# Patient Record
Sex: Male | Born: 1990 | Race: White | Hispanic: No | Marital: Single | Smoking: Current some day smoker
Health system: Southern US, Community
[De-identification: ages and names within clinical notes are randomized; demographics above are authoritative.]

---
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
## 2011-05-27 ENCOUNTER — Emergency Department (HOSPITAL_COMMUNITY)
Admission: EM | Admit: 2011-05-27 | Discharge: 2011-05-27 | Disposition: A | Discharge status: Home / Self Care | Payer: Medicaid Other | Attending: Emergency Medicine | Admitting: Emergency Medicine

## 2011-05-27 ENCOUNTER — Emergency Department (HOSPITAL_COMMUNITY): Payer: Medicaid Other

## 2011-05-27 DIAGNOSIS — N23 Unspecified renal colic: Secondary | ICD-10-CM | POA: Insufficient documentation

## 2011-05-27 DIAGNOSIS — R109 Unspecified abdominal pain: Secondary | ICD-10-CM | POA: Insufficient documentation

## 2011-05-27 LAB — POCT I-STAT, CHEM 8
BUN: 17 mg/dL (ref 6–23)
Calcium, Ion: 1.18 mmol/L (ref 1.12–1.32)
Creatinine, Ser: 1 mg/dL (ref 0.50–1.35)
Glucose, Bld: 121 mg/dL — ABNORMAL HIGH (ref 70–99)
Hemoglobin: 15.3 g/dL (ref 13.0–17.0)
Potassium: 3.7 mEq/L (ref 3.5–5.1)
TCO2: 24 mmol/L (ref 0–100)

## 2011-05-27 LAB — URINALYSIS, ROUTINE W REFLEX MICROSCOPIC
Glucose, UA: NEGATIVE mg/dL
Ketones, ur: NEGATIVE mg/dL
Nitrite: NEGATIVE
pH: 8 (ref 5.0–8.0)

## 2011-05-29 ENCOUNTER — Emergency Department (HOSPITAL_COMMUNITY)
Admission: EM | Admit: 2011-05-29 | Discharge: 2011-05-29 | Disposition: A | Discharge status: Home / Self Care | Payer: Medicaid Other | Attending: Emergency Medicine | Admitting: Emergency Medicine

## 2011-05-29 DIAGNOSIS — R109 Unspecified abdominal pain: Secondary | ICD-10-CM | POA: Insufficient documentation

## 2011-05-29 DIAGNOSIS — M549 Dorsalgia, unspecified: Secondary | ICD-10-CM | POA: Insufficient documentation

## 2011-05-29 DIAGNOSIS — R112 Nausea with vomiting, unspecified: Secondary | ICD-10-CM | POA: Insufficient documentation

## 2011-05-29 LAB — POCT I-STAT, CHEM 8
Calcium, Ion: 1.19 mmol/L (ref 1.12–1.32)
Glucose, Bld: 95 mg/dL (ref 70–99)
HCT: 46 % (ref 36.0–46.0)
Hemoglobin: 15.6 g/dL — ABNORMAL HIGH (ref 12.0–15.0)

## 2011-05-30 ENCOUNTER — Emergency Department (HOSPITAL_COMMUNITY): Payer: Medicaid Other

## 2011-05-30 ENCOUNTER — Encounter (HOSPITAL_COMMUNITY): Payer: Self-pay | Admitting: Radiology

## 2011-05-30 ENCOUNTER — Emergency Department (HOSPITAL_COMMUNITY)
Admission: EM | Admit: 2011-05-30 | Discharge: 2011-05-31 | Disposition: A | Discharge status: Home / Self Care | Payer: Medicaid Other | Attending: Emergency Medicine | Admitting: Emergency Medicine

## 2011-05-30 DIAGNOSIS — R112 Nausea with vomiting, unspecified: Secondary | ICD-10-CM | POA: Insufficient documentation

## 2011-05-30 DIAGNOSIS — F909 Attention-deficit hyperactivity disorder, unspecified type: Secondary | ICD-10-CM | POA: Insufficient documentation

## 2011-05-30 DIAGNOSIS — R109 Unspecified abdominal pain: Secondary | ICD-10-CM | POA: Insufficient documentation

## 2011-05-30 LAB — CBC
HCT: 40.6 % (ref 39.0–52.0)
MCH: 30.6 pg (ref 26.0–34.0)
MCHC: 36 g/dL (ref 30.0–36.0)
Platelets: 279 10*3/uL (ref 150–400)

## 2011-05-30 LAB — URINALYSIS, ROUTINE W REFLEX MICROSCOPIC
Bilirubin Urine: NEGATIVE
Nitrite: NEGATIVE
Protein, ur: NEGATIVE mg/dL
Specific Gravity, Urine: 1.024 (ref 1.005–1.030)
pH: 8 (ref 5.0–8.0)

## 2011-05-30 LAB — COMPREHENSIVE METABOLIC PANEL
Calcium: 9.8 mg/dL (ref 8.4–10.5)
Glucose, Bld: 91 mg/dL (ref 70–99)
Potassium: 4.1 mEq/L (ref 3.5–5.1)
Sodium: 140 mEq/L (ref 135–145)

## 2011-05-30 LAB — DIFFERENTIAL
Basophils Absolute: 0 10*3/uL (ref 0.0–0.1)
Lymphocytes Relative: 18 % (ref 12–46)
Lymphs Abs: 2.3 10*3/uL (ref 0.7–4.0)
Monocytes Absolute: 0.6 10*3/uL (ref 0.1–1.0)
Neutro Abs: 10 10*3/uL — ABNORMAL HIGH (ref 1.7–7.7)

## 2011-05-30 LAB — ETHANOL: Alcohol, Ethyl (B): <11 mg/dL (ref 0–11)

## 2011-05-30 MED ORDER — IOHEXOL 300 MG/ML  SOLN
100.0000 mL | Freq: Once | INTRAMUSCULAR | Status: AC | PRN
  Administered 2011-05-30: 100 mL via INTRAVENOUS

## 2012-09-27 IMAGING — CT CT ABD-PELV W/O CM
2 of 4 series · 17 of 46 positions shown, 19 images · non-contrast
Comparison: None.

CLINICAL DATA: Bilateral flank pain and nausea.

CT ABDOMEN AND PELVIS WITHOUT CONTRAST
TECHNIQUE: Multidetector CT imaging of the abdomen and pelvis was
performed following the standard protocol without intravenous
contrast.

[Series 2: abd/pel w/o · axial · non-contrast · 0.67mm/px · z∈[+836,+1236]mm · 14 of 88 slices shown, 16 images]
[im 4/88  soft-tissue]
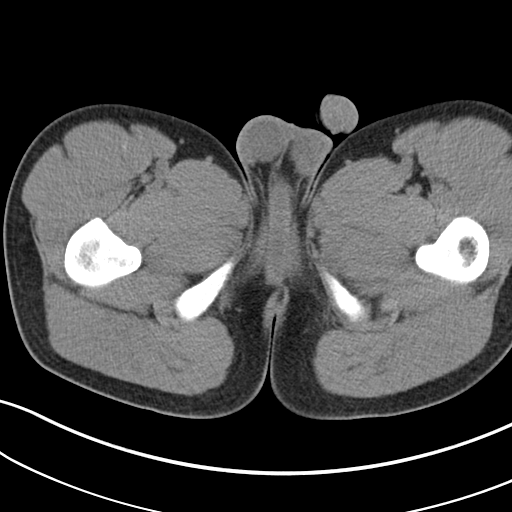
[im 4/88  bone]
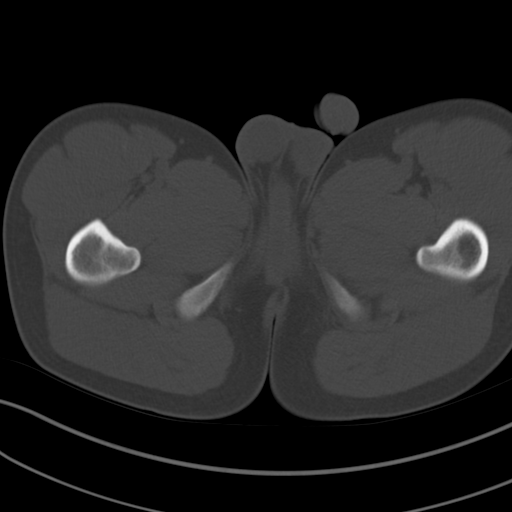
[im 11/88  soft-tissue]
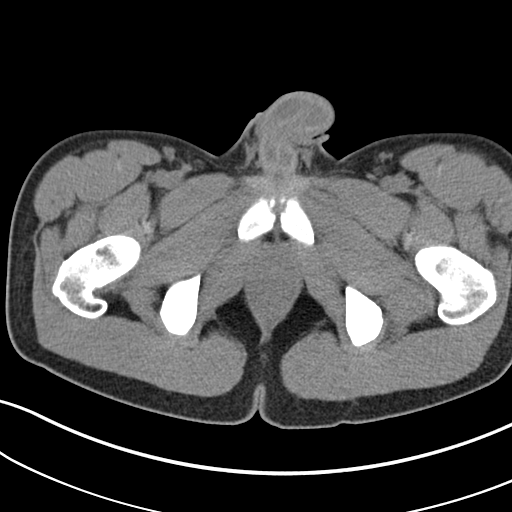
[im 17/88  soft-tissue]
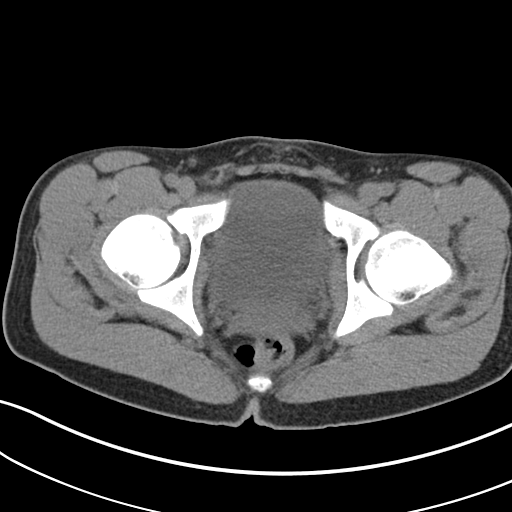
[im 24/88  soft-tissue]
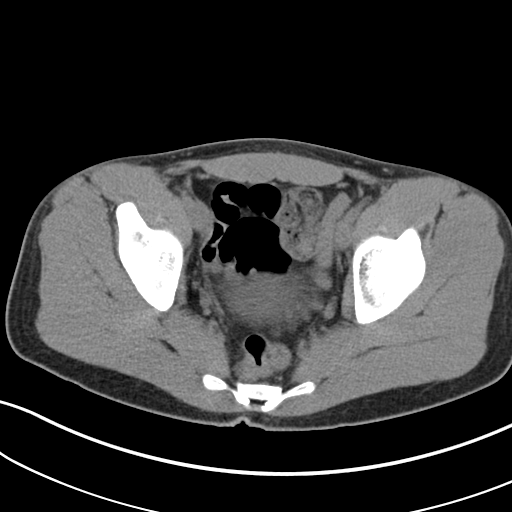
[im 31/88  soft-tissue]
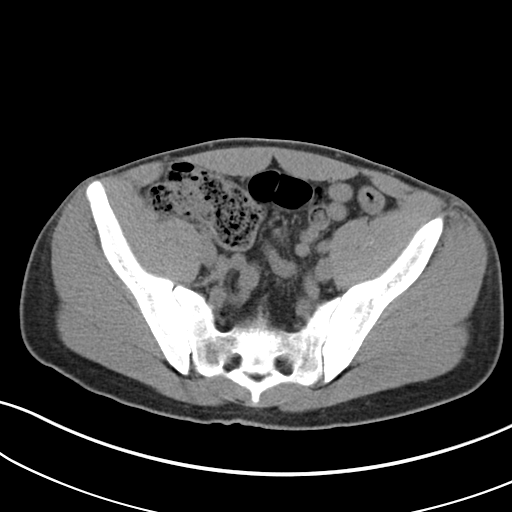
[im 34/88  soft-tissue]
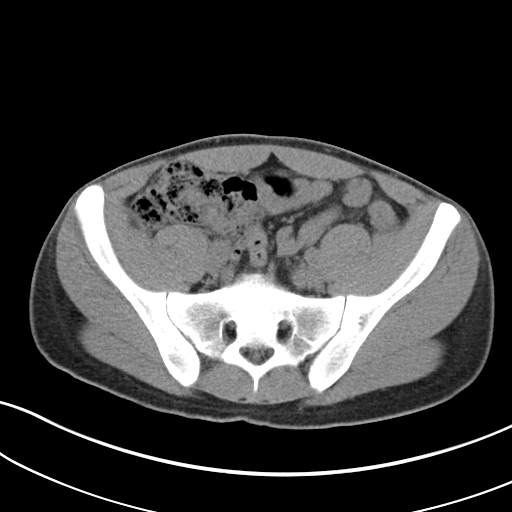
[im 41/88  soft-tissue]
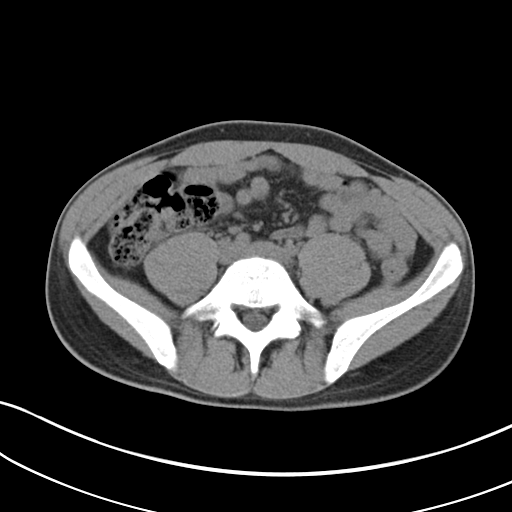
[im 47/88  soft-tissue]
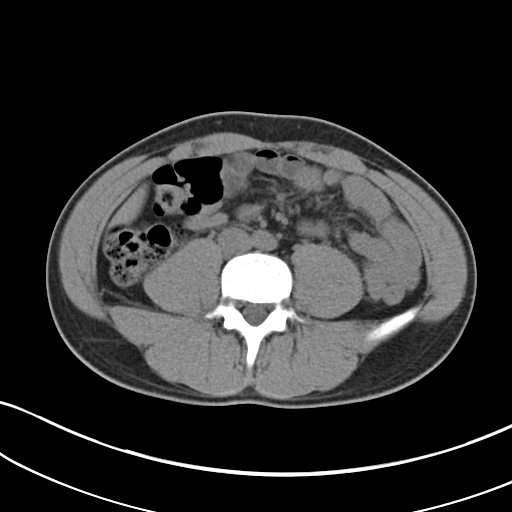
[im 54/88  soft-tissue]
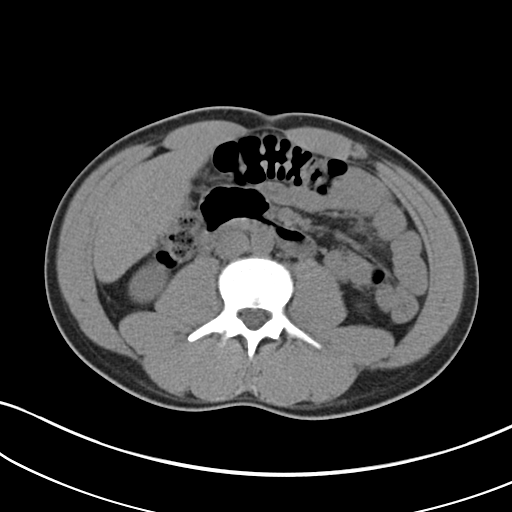
[im 54/88  bone]
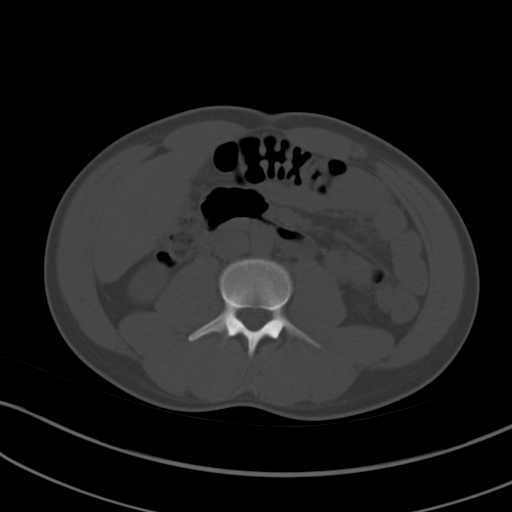
[im 57/88  soft-tissue]
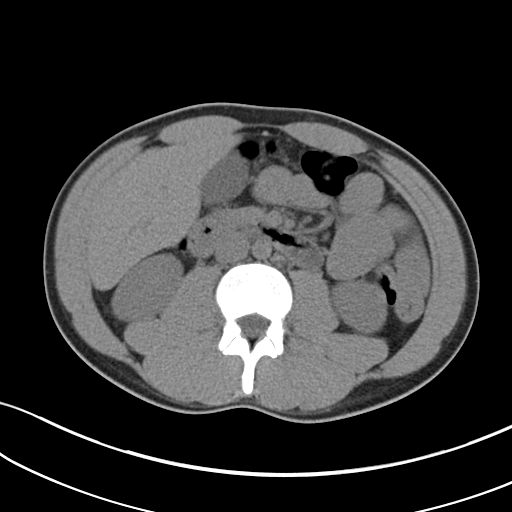
[im 64/88  soft-tissue]
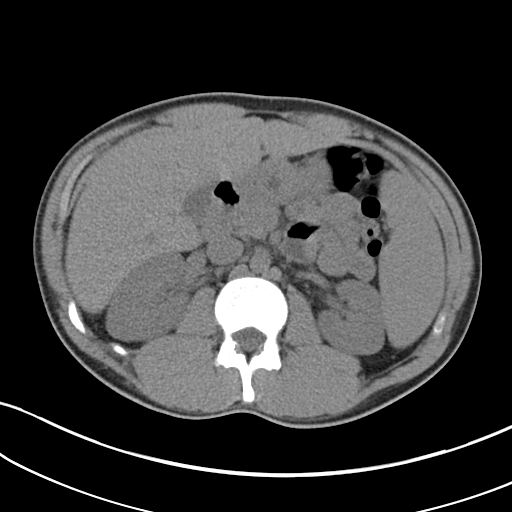
[im 71/88  soft-tissue]
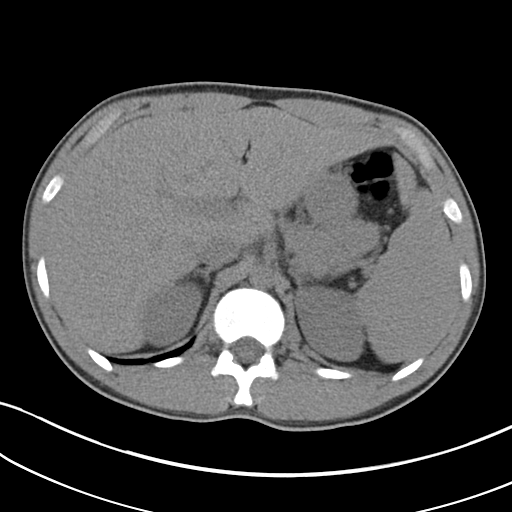
[im 77/88  soft-tissue]
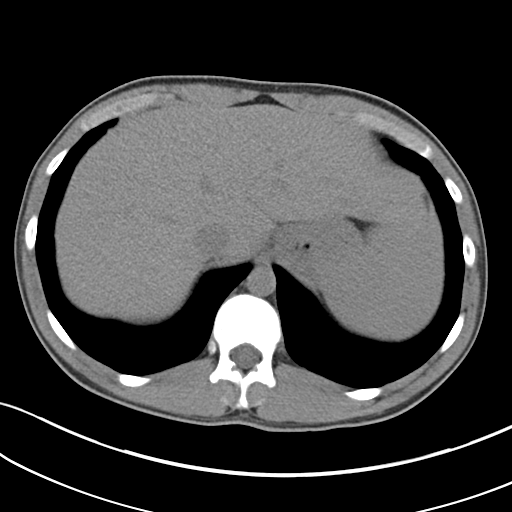
[im 84/88  soft-tissue]
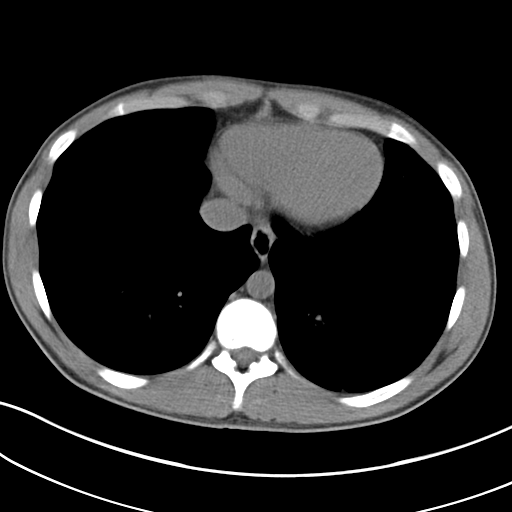

[Series 4: coronal · coronal · 0.67mm/px · 3 of 45 slices shown]
[im 15/45  soft-tissue]
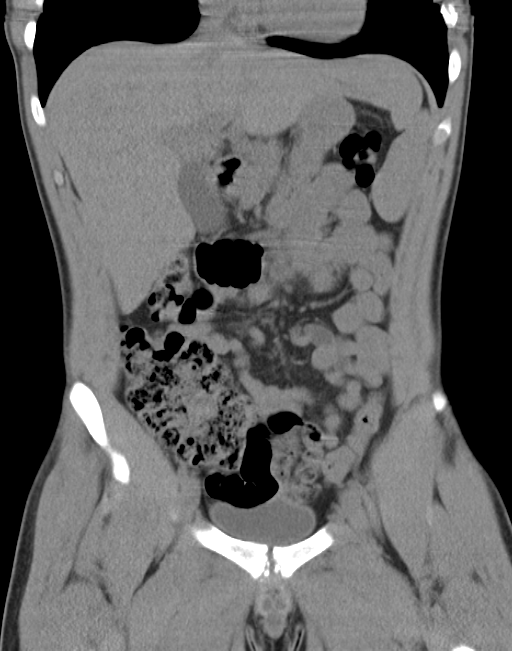
[im 20/45  soft-tissue]
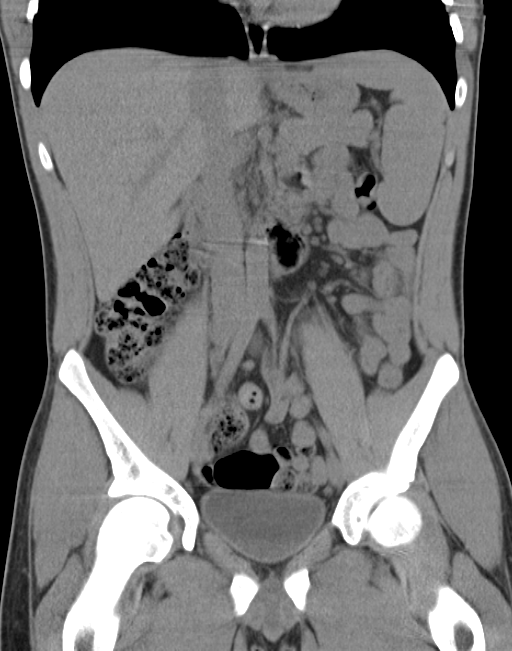
[im 25/45  soft-tissue]
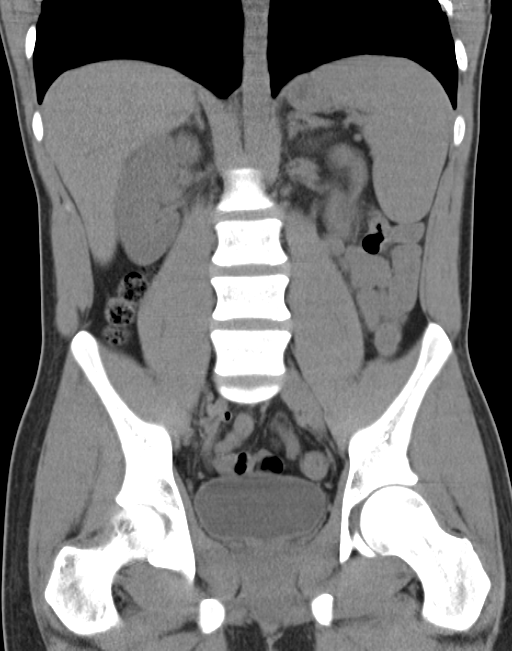

[17 of 46 positions shown; findings below may reference images not displayed]

FINDINGS: No evidence of renal calculi, ureteral calculi or renal
obstruction.  No inflammatory processes are identified.  Solid
organs of the abdomen are unremarkable by unenhanced CT.  There is
no evidence of bowel obstruction, hernia or mass.  The bladder is
unremarkable.  No enlarged lymph nodes identified.  Bony structures
are unremarkable.
IMPRESSION: Normal unenhanced CT of the abdomen and pelvis.  No acute findings.

## 2015-03-25 ENCOUNTER — Emergency Department (HOSPITAL_COMMUNITY): Payer: Medicaid Other

## 2015-03-25 ENCOUNTER — Emergency Department (HOSPITAL_COMMUNITY)
Admission: EM | Admit: 2015-03-25 | Discharge: 2015-03-26 | Disposition: A | Discharge status: Home / Self Care | Payer: Self-pay | Attending: Emergency Medicine | Admitting: Emergency Medicine

## 2015-03-25 ENCOUNTER — Encounter (HOSPITAL_COMMUNITY): Payer: Self-pay | Admitting: Emergency Medicine

## 2015-03-25 DIAGNOSIS — R110 Nausea: Secondary | ICD-10-CM | POA: Insufficient documentation

## 2015-03-25 DIAGNOSIS — K529 Noninfective gastroenteritis and colitis, unspecified: Secondary | ICD-10-CM

## 2015-03-25 DIAGNOSIS — R10813 Right lower quadrant abdominal tenderness: Secondary | ICD-10-CM

## 2015-03-25 DIAGNOSIS — K6389 Other specified diseases of intestine: Secondary | ICD-10-CM | POA: Insufficient documentation

## 2015-03-25 DIAGNOSIS — R1011 Right upper quadrant pain: Secondary | ICD-10-CM | POA: Insufficient documentation

## 2015-03-25 DIAGNOSIS — Z720 Tobacco use: Secondary | ICD-10-CM | POA: Insufficient documentation

## 2015-03-25 LAB — COMPREHENSIVE METABOLIC PANEL
ALBUMIN: 4.3 g/dL (ref 3.5–5.0)
ALT: 47 U/L (ref 17–63)
AST: 25 U/L (ref 15–41)
Alkaline Phosphatase: 106 U/L (ref 38–126)
Anion gap: 8 (ref 5–15)
BUN: 18 mg/dL (ref 6–20)
CALCIUM: 9.6 mg/dL (ref 8.9–10.3)
CO2: 27 mmol/L (ref 22–32)
Chloride: 106 mmol/L (ref 101–111)
Creatinine, Ser: 1.27 mg/dL — ABNORMAL HIGH (ref 0.61–1.24)
GFR calc Af Amer: >60 mL/min (ref >60)
GFR calc non Af Amer: >60 mL/min (ref >60)
Glucose, Bld: 111 mg/dL — ABNORMAL HIGH (ref 65–99)
Potassium: 4.6 mmol/L (ref 3.5–5.1)
SODIUM: 141 mmol/L (ref 135–145)
TOTAL PROTEIN: 7.2 g/dL (ref 6.5–8.1)
Total Bilirubin: 0.3 mg/dL (ref 0.3–1.2)

## 2015-03-25 LAB — URINALYSIS, ROUTINE W REFLEX MICROSCOPIC
BILIRUBIN URINE: NEGATIVE
GLUCOSE, UA: NEGATIVE mg/dL
Hgb urine dipstick: NEGATIVE
Ketones, ur: NEGATIVE mg/dL
Leukocytes, UA: NEGATIVE
Nitrite: NEGATIVE
PROTEIN: NEGATIVE mg/dL
Specific Gravity, Urine: 1.019 (ref 1.005–1.030)
UROBILINOGEN UA: 0.2 mg/dL (ref 0.0–1.0)
pH: 7 (ref 5.0–8.0)

## 2015-03-25 LAB — CBC WITH DIFFERENTIAL/PLATELET
BASOS ABS: 0 10*3/uL (ref 0.0–0.1)
Basophils Relative: 0 % (ref 0–1)
Eosinophils Absolute: 0.2 10*3/uL (ref 0.0–0.7)
Eosinophils Relative: 2 % (ref 0–5)
HCT: 40.6 % (ref 39.0–52.0)
Hemoglobin: 13.7 g/dL (ref 13.0–17.0)
LYMPHS PCT: 31 % (ref 12–46)
Lymphs Abs: 2.6 10*3/uL (ref 0.7–4.0)
MCH: 30 pg (ref 26.0–34.0)
MCHC: 33.7 g/dL (ref 30.0–36.0)
MCV: 88.8 fL (ref 78.0–100.0)
MONO ABS: 0.7 10*3/uL (ref 0.1–1.0)
MONOS PCT: 9 % (ref 3–12)
NEUTROS ABS: 4.9 10*3/uL (ref 1.7–7.7)
Neutrophils Relative %: 58 % (ref 43–77)
Platelets: 253 10*3/uL (ref 150–400)
RBC: 4.57 MIL/uL (ref 4.22–5.81)
RDW: 11.9 % (ref 11.5–15.5)
WBC: 8.4 10*3/uL (ref 4.0–10.5)

## 2015-03-25 LAB — I-STAT TROPONIN, ED: Troponin i, poc: 0 ng/mL (ref 0.00–0.08)

## 2015-03-25 LAB — LIPASE, BLOOD: Lipase: 10 U/L — ABNORMAL LOW (ref 22–51)

## 2015-03-25 MED ORDER — ONDANSETRON HCL 4 MG/2ML IJ SOLN: 4.0000 mg | Freq: Once | INTRAMUSCULAR | Status: DC

## 2015-03-25 MED ORDER — IOHEXOL 300 MG/ML  SOLN
25.0000 mL | Freq: Once | INTRAMUSCULAR | Status: AC | PRN
  Administered 2015-03-25: 25 mL via ORAL

## 2015-03-25 MED ORDER — HYDROCODONE-ACETAMINOPHEN 5-325 MG PO TABS: 1.0000 | ORAL_TABLET | Freq: Four times a day (QID) | ORAL | Status: AC | PRN

## 2015-03-25 MED ORDER — ONDANSETRON HCL 8 MG PO TABS
8.0000 mg | ORAL_TABLET | Freq: Three times a day (TID) | ORAL | Status: AC | PRN
Start: 2015-03-25 — End: ?

## 2015-03-25 MED ORDER — IBUPROFEN 600 MG PO TABS: 600.0000 mg | ORAL_TABLET | Freq: Three times a day (TID) | ORAL | Status: DC

## 2015-03-25 MED ORDER — SODIUM CHLORIDE 0.9 % IV BOLUS (SEPSIS)
1000.0000 mL | Freq: Once | INTRAVENOUS | Status: AC
  Administered 2015-03-25: 1000 mL via INTRAVENOUS

## 2015-03-25 MED ORDER — IOHEXOL 300 MG/ML  SOLN
100.0000 mL | Freq: Once | INTRAMUSCULAR | Status: AC | PRN
  Administered 2015-03-25: 100 mL via INTRAVENOUS

## 2015-03-25 NOTE — ED Provider Notes (Signed)
CSN: [id number]     Arrival date & time 03/25/15  1958 History   First MD Initiated Contact with Patient 03/25/15 2036     Chief Complaint  Patient presents with  . Abdominal Pain     (Consider location/radiation/quality/duration/timing/severity/associated sxs/prior Treatment) HPI Comments: Harry Matthews is a 24 y.o. healthy male who presents to the ED with complaints of right upper quadrant abdominal pain and nausea 2 days. He reports pain is 8/10 constant sharp pain radiating to his periumbilical area and right lower quadrant, worse with movement, coughing, and breathing, and with no known alleviating factors given the has not tried anything prior to arrival. He endorses that he does a lot of heavy lifting at his job, but this is normal for him. He denies any fevers, chills, chest pain, shortness breath, cough, swelling in his legs, and recent travel/surgery/immobilization, active cancer, vomiting, diarrhea, constipation, obstipation, melena, hematochezia, dysuria, hematuria, flank pain, numbness, tingling, weakness, suspicious food intake, sick contacts, alcohol use, NSAID use, prior abdominal surgeries, or recent antibiotics.  Patient is a 24 y.o. male presenting with abdominal pain. The history is provided by the patient. No language interpreter was used.  Abdominal Pain Pain location:  RLQ and RUQ Pain quality: sharp   Pain radiates to:  Periumbilical region Pain severity:  Moderate Onset quality:  Gradual Duration:  2 days Timing:  Constant Progression:  Worsening Chronicity:  New Context: not alcohol use, not recent illness, not recent travel, not sick contacts and not suspicious food intake   Relieved by:  None tried Worsened by:  Movement, coughing and deep breathing Ineffective treatments:  None tried Associated symptoms: nausea   Associated symptoms: no chest pain, no chills, no constipation, no cough, no diarrhea, no dysuria, no fever, no flatus, no hematemesis, no  hematochezia, no hematuria, no melena, no shortness of breath and no vomiting   Risk factors: no alcohol abuse, has not had multiple surgeries, no NSAID use and not obese     History reviewed. No pertinent past medical history. History reviewed. No pertinent past surgical history. Family History  Problem Relation Age of Onset  . Hypertension Other   . Diabetes Other    History  Substance Use Topics  . Smoking status: Current Some Day Smoker    Types: Cigarettes  . Smokeless tobacco: Not on file  . Alcohol Use: Yes     Comment: occ    Review of Systems  Constitutional: Negative for fever and chills.  Respiratory: Negative for cough and shortness of breath.   Cardiovascular: Negative for chest pain and leg swelling.  Gastrointestinal: Positive for nausea and abdominal pain. Negative for vomiting, diarrhea, constipation, blood in stool, melena, hematochezia, flatus and hematemesis.  Genitourinary: Negative for dysuria, hematuria and flank pain.  Musculoskeletal: Negative for myalgias and arthralgias.  Skin: Negative for color change.  Allergic/Immunologic: Negative for immunocompromised state.  Neurological: Negative for weakness and numbness.  Psychiatric/Behavioral: Negative for confusion.   10 Systems reviewed and are negative for acute change except as noted in the HPI.    Allergies  Review of patient's allergies indicates no known allergies.  Home Medications   Prior to Admission medications   Not on File   BP 133/87 mmHg  Pulse 81  Temp(Src) 97.9 F (36.6 C) (Oral)  Resp 18  Ht  (1.753 m)  Wt 200 lb (90.719 kg)  BMI 29.52 kg/m2  SpO2 97% Physical Exam  Constitutional: He is oriented to person, place, and time. Vital signs  are normal. He appears well-developed and well-nourished.  Non-toxic appearance. No distress.  Afebrile, nontoxic, NAD  HENT:  Head: Normocephalic and atraumatic.  Mouth/Throat: Oropharynx is clear and moist and mucous membranes are  normal.  Eyes: Conjunctivae and EOM are normal. Right eye exhibits no discharge. Left eye exhibits no discharge.  Neck: Normal range of motion. Neck supple.  Cardiovascular: Normal rate, regular rhythm, normal heart sounds and intact distal pulses.  Exam reveals no gallop and no friction rub.   No murmur heard. Pulmonary/Chest: Effort normal and breath sounds normal. No respiratory distress. He has no decreased breath sounds. He has no wheezes. He has no rhonchi. He has no rales.  Abdominal: Soft. Normal appearance and bowel sounds are normal. He exhibits no distension. There is tenderness in the right upper quadrant and right lower quadrant. There is tenderness at McBurney's point and positive Murphy's sign. There is no rigidity, no rebound, no guarding and no CVA tenderness.  Soft, nondistended, +BS throughout, with RUQ and RLQ TTP near mcburney's point, no r/g/r, +murphy's, +mcburney's, neg psoas sign, neg rovsing's, neg foot tap test, no CVA TTP   Musculoskeletal: Normal range of motion.  Neurological: He is alert and oriented to person, place, and time. He has normal strength. No sensory deficit.  Skin: Skin is warm, dry and intact. No rash noted.  Psychiatric: He has a normal mood and affect.  Nursing note and vitals reviewed.   ED Course  Procedures (including critical care time) Labs Review Labs Reviewed  COMPREHENSIVE METABOLIC PANEL - Abnormal; Notable for the following:    Glucose, Bld 111 (*)    Creatinine, Ser 1.27 (*)    All other components within normal limits  LIPASE, BLOOD - Abnormal; Notable for the following:    Lipase 10 (*)    All other components within normal limits  URINALYSIS, ROUTINE W REFLEX MICROSCOPIC (NOT AT [facility]) - Abnormal; Notable for the following:    APPearance CLOUDY (*)    All other components within normal limits  CBC WITH DIFFERENTIAL/PLATELET  I-STAT TROPOININ, ED    Imaging Review Ct Abdomen Pelvis W Contrast  03/25/2015   CLINICAL DATA:   RIGHT chest pain, pain with inspiration or cough beginning yesterday. Nausea without vomiting or diarrhea.  EXAM: CT ABDOMEN AND PELVIS WITH CONTRAST  TECHNIQUE: Multidetector CT imaging of the abdomen and pelvis was performed using the standard protocol following bolus administration of intravenous contrast.  CONTRAST:  25mL OMNIPAQUE IOHEXOL 300 MG/ML SOLN, 100mL OMNIPAQUE IOHEXOL 300 MG/ML SOLN  COMPARISON:  CT abdomen and pelvis July 30, 2011  FINDINGS: LUNG BASES: Lingular atelectasis. Dependent atelectasis RIGHT lung base. Heart size is normal, no pericardial effusions.  SOLID ORGANS: The liver, spleen, gallbladder, pancreas and adrenal glands are unremarkable.  GASTROINTESTINAL TRACT: The stomach, small and large bowel are normal in course and caliber. Focal inflammation of the ascending parrot colonic fat, RIGHT upper quadrant. Enteric contrast has not yet reached the distal small bowel. Normal appendix.  KIDNEYS/ URINARY TRACT: Kidneys are orthotopic, demonstrating symmetric enhancement. No nephrolithiasis, hydronephrosis or solid renal masses. The unopacified ureters are normal in course and caliber. Urinary bladder is partially distended and unremarkable.  PERITONEUM/RETROPERITONEUM: Aortoiliac vessels are normal in course and caliber. No lymphadenopathy by CT size criteria. No prostatomegaly. No intraperitoneal free fluid nor free air.  SOFT TISSUE/OSSEOUS STRUCTURES: Non-suspicious. Small fat containing RIGHT inguinal hernia. Minimal grade 1 L5-S1 retrolisthesis on degenerative basis. Scattered Schmorl's nodes.  IMPRESSION: RIGHT upper quadrant epiploic appendagitis.  Normal  appendix.  No urolithiasis.   Electronically Signed   By: Awilda Metro M.D.   On: 03/25/2015 23:02     EKG Interpretation None     ED ECG REPORT   Date: 03/25/2015  Rate: 85  Rhythm: normal sinus rhythm  QRS Axis: normal  Intervals: normal  ST/T Wave abnormalities: normal  Conduction Disutrbances:none   Narrative Interpretation:   Old EKG Reviewed: none available  I have personally reviewed the EKG tracing and agree with the computerized printout as noted.   MDM   Final diagnoses:  RUQ abdominal pain  RLQ abdominal tenderness  Nausea  Epiploic appendagitis    24 y.o. male here with RUQ and RLQ abd pain x2 days. Worsens to move or breathe. On exam, +murphy's and also +mcburney's tenderness, nonperitoneal, neg rovsing's and neg psoas sign. No CVA tenderness. Could be musculoskeletal but given tenderness in RLQ and RUQ, will obtain CT to eval gallbladder, kidneys, and appendix, as well as eval for any other etiology. Discussed that if gallbladder not well visualized on CT may need to get U/S. Will get labs. Pt declines pain meds, will give nausea meds and fluids. Will get urinalysis, EKG, and troponin in addition to basic labs. Will reassess shortly.   11:52 PM U/A unremarkable. Trop neg, EKG unremarkable. CBC w/diff unremarkable. CMP showing mildly elevated Cr at 1.27 but otherwise unremarkable, lipase WNL. CT abd/pelvis showing RUQ epiploic appendagitis, normal appendix and gallbladder. Pt asleep upon reassessment, has tolerating PO well without meds, and declined pain meds here. Discussed his condition at length, and discussed treatment plan of ibuprofen TID x6 days, and norco PRN, with zofran PRN. Discussed f/up with [facility] in 5 days for recheck of symptoms and ongoing care, but discussed at length strict return precautions that would warrant admission and possible surgical eval. I explained the diagnosis and have given explicit precautions to return to the ER including for any other new or worsening symptoms. The patient understands and accepts the medical plan as it's been dictated and I have answered their questions. Discharge instructions concerning home care and prescriptions have been given. The patient is STABLE and is discharged to home in good condition.  BP 133/87 mmHg  Pulse 81   Temp(Src) 97.9 F (36.6 C) (Oral)  Resp 18  Ht 5\' 9"  (1.753 m)  Wt 200 lb (90.719 kg)  BMI 29.52 kg/m2  SpO2 97%  Meds ordered this encounter  Medications  . sodium chloride 0.9 % bolus 1,000 mL    Sig:   . iohexol (OMNIPAQUE) 300 MG/ML solution 25 mL    Sig:   . iohexol (OMNIPAQUE) 300 MG/ML solution 100 mL    Sig:   . ibuprofen (ADVIL,MOTRIN) 600 MG tablet    Sig: Take 1 tablet (600 mg total) by mouth every 8 (eight) hours. X 6 days    Dispense:  18 tablet    Refill:  0    Order Specific Question:  Supervising Provider    Answer:  MILLER, BRIAN [[id number]]  . HYDROcodone-acetaminophen (NORCO) 5-325 MG per tablet    Sig: Take 1 tablet by mouth every 6 (six) hours as needed for severe pain.    Dispense:  24 tablet    Refill:  0    Order Specific Question:  Supervising Provider    Answer:  MILLER, BRIAN [[id number]]  . ondansetron (ZOFRAN) 8 MG tablet    Sig: Take 1 tablet (8 mg total) by mouth every 8 (eight) hours as needed for nausea or  vomiting.    Dispense:  12 tablet    Refill:  0    Order Specific Question:  Supervising Provider    Answer:  Eber Hong [[id number]]     Maurice Ramseur Camprubi-Soms, PA-C 03/25/15 2355  Arby Barrette, MD 03/29/15 [phone]

## 2015-03-25 NOTE — Progress Notes (Signed)
EDCM spoke to patient at bedside. Patient confirms he does not have a pcp or insurance living in [place].  EDCM provided patient with pamphlet to [facility], informed patient of services there and walk in times.  EDCM also provided patient with list of pcps who accept self pay patients, list of discount pharmacies and websites needymeds.org and GoodRX.com for medication assistance, phone number to inquire about the orange card, phone number to inquire about Mediciad, phone number to inquire about the Affordable Care Act, financial resources in the community such as local churches, [employer], urban ministries, and dental assistance for uninsured patients.  Patient thankful for resources.  No further EDCM needs at this time. 

## 2015-03-25 NOTE — Discharge Instructions (Signed)
Your abdominal pain appears to be caused by epiploic appendigitis, which is an inflammatory condition of a portion of the colon. It is usually self limited. The treatment is by using ibuprofen as directed, and norco as needed for pain but don't drive or operate machinery while taking norco. Stay well hydrated. Use zofran as needed for nausea. Follow up with [facility] in 5 days for recheck of symptoms and ongoing management. Return to the ER for changes or worsening symptoms, including but not limited to: high fever, progressive pain, worsening nausea, vomiting, or inability to tolerate an oral diet.    Abdominal (belly) pain can be caused by many things. Your caregiver performed an examination and possibly ordered blood/urine tests and imaging (CT scan, x-rays, ultrasound). Many cases can be observed and treated at home after initial evaluation in the emergency department. Even though you are being discharged home, abdominal pain can be unpredictable. Therefore, you need a repeated exam if your pain does not resolve, returns, or worsens. Most patients with abdominal pain don't have to be admitted to the hospital or have surgery, but serious problems like appendicitis and gallbladder attacks can start out as nonspecific pain. Many abdominal conditions cannot be diagnosed in one visit, so follow-up evaluations are very important. SEEK IMMEDIATE MEDICAL ATTENTION IF YOU DEVELOP ANY OF THE FOLLOWING SYMPTOMS:  The pain does not go away or becomes severe.   A temperature above 101 develops.   Repeated vomiting occurs (multiple episodes).   The pain becomes localized to portions of the abdomen. The right side could possibly be appendicitis. In an adult, the left lower portion of the abdomen could be colitis or diverticulitis.   Blood is being passed in stools or vomit (bright red or black tarry stools).   Return also if you develop chest pain, difficulty breathing, dizziness or fainting, or  become confused, poorly responsive, or inconsolable (young children).  The constipation stays for more than 4 days.   There is belly (abdominal) or rectal pain.   You do not seem to be getting better.     Abdominal Pain Many things can cause belly (abdominal) pain. Most times, the belly pain is not dangerous. Many cases of belly pain can be watched and treated at home. HOME CARE   Do not take medicines that help you go poop (laxatives) unless told to by your doctor.  Only take medicine as told by your doctor.  Eat or drink as told by your doctor. Your doctor will tell you if you should be on a special diet. GET HELP IF:  You do not know what is causing your belly pain.  You have belly pain while you are sick to your stomach (nauseous) or have runny poop (diarrhea).  You have pain while you pee or poop.  Your belly pain wakes you up at night.  You have belly pain that gets worse or better when you eat.  You have belly pain that gets worse when you eat fatty foods.  You have a fever. GET HELP RIGHT AWAY IF:   The pain does not go away within 2 hours.  You keep throwing up (vomiting).  The pain changes and is only in the right or left part of the belly.  You have bloody or tarry looking poop. MAKE SURE YOU:   Understand these instructions.  Will watch your condition.  Will get help right away if you are not doing well or get worse. Document Released: 02/21/2008 Document Revised:  09/09/2013 Document Reviewed: 05/14/2013 Exit[employer] Patient Information 2015 [place]. This information is not intended to replace advice given to you by your health care provider. Make sure you discuss any questions you have with your health care provider.  Nausea, Adult Nausea means you feel sick to your stomach or need to throw up (vomit). It may be a sign of a more serious problem. If nausea gets worse, you may throw up. If you throw up a lot, you may lose too much body fluid  (dehydration). HOME CARE   Get plenty of rest.  Ask your doctor how to replace body fluid losses (rehydrate).  Eat small amounts of food. Sip liquids more often.  Take all medicines as told by your doctor. GET HELP RIGHT AWAY IF:  You have a fever.  You pass out (faint).  You keep throwing up or have blood in your throw up.  You are very weak, have dry lips or a dry mouth, or you are very thirsty (dehydrated).  You have dark or bloody poop (stool).  You have very bad chest or belly (abdominal) pain.  You do not get better after 2 days, or you get worse.  You have a headache. MAKE SURE YOU:  Understand these instructions.  Will watch your condition.  Will get help right away if you are not doing well or get worse. Document Released: 08/24/2011 Document Revised: 11/27/2011 Document Reviewed: 08/24/2011 [facility] [employer] Patient Information 2015 [place]. This information is not intended to replace advice given to you by your health care provider. Make sure you discuss any questions you have with your health care provider.

## 2015-03-25 NOTE — ED Notes (Signed)
Pt states he is having pain on his right side right below his rib cage  Pt states it hurts worse to take a deep breath or sneeze or cough  Pt states the pain started yesterday  Pt has nausea without vomiting or diarrhea

## 2015-03-26 NOTE — ED Notes (Signed)
Patient is alert and oriented x3.  He was given DC instructions and follow up visit instructions.  Patient gave verbal understanding.  He was DC ambulatory under his own power to home.  V/S stable.  He was not showing any signs of distress on DC 

## 2016-05-06 ENCOUNTER — Encounter (HOSPITAL_COMMUNITY): Payer: Self-pay | Admitting: *Deleted

## 2016-05-06 ENCOUNTER — Emergency Department (HOSPITAL_COMMUNITY): Payer: Medicaid Other

## 2016-05-06 ENCOUNTER — Emergency Department (HOSPITAL_COMMUNITY)
Admission: EM | Admit: 2016-05-06 | Discharge: 2016-05-06 | Disposition: A | Discharge status: Home / Self Care | Payer: Medicaid Other | Attending: Emergency Medicine | Admitting: Emergency Medicine

## 2016-05-06 DIAGNOSIS — W268XXA Contact with other sharp object(s), not elsewhere classified, initial encounter: Secondary | ICD-10-CM | POA: Insufficient documentation

## 2016-05-06 DIAGNOSIS — Y999 Unspecified external cause status: Secondary | ICD-10-CM | POA: Insufficient documentation

## 2016-05-06 DIAGNOSIS — F17210 Nicotine dependence, cigarettes, uncomplicated: Secondary | ICD-10-CM | POA: Insufficient documentation

## 2016-05-06 DIAGNOSIS — Y929 Unspecified place or not applicable: Secondary | ICD-10-CM | POA: Insufficient documentation

## 2016-05-06 DIAGNOSIS — S61211A Laceration without foreign body of left index finger without damage to nail, initial encounter: Secondary | ICD-10-CM | POA: Insufficient documentation

## 2016-05-06 DIAGNOSIS — S61219A Laceration without foreign body of unspecified finger without damage to nail, initial encounter: Secondary | ICD-10-CM

## 2016-05-06 DIAGNOSIS — Y939 Activity, unspecified: Secondary | ICD-10-CM | POA: Insufficient documentation

## 2016-05-06 MED ORDER — IBUPROFEN 800 MG PO TABS: 800.0000 mg | ORAL_TABLET | Freq: Three times a day (TID) | ORAL | 0 refills | Status: AC

## 2016-05-06 NOTE — Discharge Instructions (Signed)
Keep the medicated gauze in place until Monday, then remove it gently after wetting it.  Then keep your finger bandaged at work for one week and keep the finger splinted for 4-5 days.  Return here for any uncontrolled bleeding

## 2016-05-06 NOTE — ED Notes (Signed)
When unwrapping covering pts finger continues to bleed. Cannot assess the severity of the laceration at this time.

## 2016-05-06 NOTE — ED Triage Notes (Signed)
Pt comes in for a finger laceration. He states he cut his finger on a piece of metal. This occurred at 1400. Pt has had trouble getting his bleeding controlled. Unsure if his tetanus is up to date.

## 2016-05-08 NOTE — ED Provider Notes (Signed)
[facility] DEPT Provider Note   CSN: [id number] Arrival date & time: 05/06/16  1739     History   Chief Complaint Chief Complaint  Patient presents with  . Laceration    HPI Harry Matthews is a 25 y.o. male.  HPI  Harry Matthews is a 25 y.o. male who presents to the Emergency Department complaining of laceration and uncontrolled bleeding of his left index finger.  He states that he accidentally cut his finger on a piece of metal.  He reports that it "shaved" a piece of skin off his finger and he has been unable to control the bleeding for 2 hours.  He has tried direct pressure without relief.  He was seen at a local urgent care and advised to come here.  He denies pain, numbness, and anti coagulant use.  Td is up to date.  History reviewed. No pertinent past medical history.  There are no active problems to display for this patient.   History reviewed. No pertinent surgical history.     Home Medications    Prior to Admission medications   Medication Sig Start Date End Date Taking? Authorizing Provider  HYDROcodone-acetaminophen (NORCO) 5-325 MG per tablet Take 1 tablet by mouth every 6 (six) hours as needed for severe pain. 03/25/15   Mercedes Camprubi-Soms, PA-C  ibuprofen (ADVIL,MOTRIN) 800 MG tablet Take 1 tablet (800 mg total) by mouth 3 (three) times daily. 05/06/16   Charlton Boule, PA-C  ondansetron (ZOFRAN) 8 MG tablet Take 1 tablet (8 mg total) by mouth every 8 (eight) hours as needed for nausea or vomiting. 03/25/15   Mercedes Camprubi-Soms, PA-C    Family History Family History  Problem Relation Age of Onset  . Hypertension Other   . Diabetes Other     Social History Social History  Substance Use Topics  . Smoking status: Current Some Day Smoker    Types: Cigarettes  . Smokeless tobacco: Never Used  . Alcohol use Yes     Comment: occ     Allergies   Review of patient's allergies indicates no known allergies.   Review of Systems Review of Systems    Constitutional: Negative for chills and fever.  Musculoskeletal: Negative for arthralgias, back pain and joint swelling.  Skin: Positive for wound.       Laceration left index finger  Neurological: Negative for dizziness, weakness and numbness.  Hematological: Does not bruise/bleed easily.  All other systems reviewed and are negative.    Physical Exam Updated Vital Signs BP 158/96 (BP Location: Left Arm)   Pulse 95   Temp 99.1 F (37.3 C) (Oral)   Resp 18   Ht 5\' 8"  (1.727 m)   Wt 99.8 kg   SpO2 95%   BMI 33.45 kg/m   Physical Exam  Constitutional: He is oriented to person, place, and time. He appears well-developed and well-nourished. No distress.  HENT:  Head: Normocephalic and atraumatic.  Cardiovascular: Normal rate, regular rhythm, normal heart sounds and intact distal pulses.   No murmur heard. Pulmonary/Chest: Effort normal and breath sounds normal. No respiratory distress.  Musculoskeletal: He exhibits no edema or tenderness.       Left hand: He exhibits laceration. He exhibits normal range of motion, no bony tenderness, normal capillary refill and no swelling. Normal sensation noted. Normal strength noted.       Hands: Pea size skin avulsion to the dorsal left index finger.  Moderate bleeding present.  No arterial bleeding.  No bony involvement.  FROM  Neurological: He is alert and oriented to person, place, and time. He exhibits normal muscle tone. Coordination normal.  Skin: Skin is warm. Laceration noted.  Nursing note and vitals reviewed.    ED Treatments / Results  Labs (all labs ordered are listed, but only abnormal results are displayed) Labs Reviewed - No data to display  EKG  EKG Interpretation None       Radiology Dg Finger Index Left  Result Date: 05/06/2016 CLINICAL DATA:  Bleeding/laceration. EXAM: LEFT INDEX FINGER 2+V COMPARISON:  December 11, 2005 FINDINGS: A bandage is seen over the distal left index finger limiting evaluation. Within  this limitation, no fracture or foreign body is identified. IMPRESSION: Evaluation is limited due to bandaging. However, no fracture, dislocation, or foreign body is identified. Electronically Signed   By: Gerome Samavid  Williams III M.D   On: 05/06/2016 19:12    Procedures Procedures (including critical care time)  LACERATION REPAIR Performed by: Marrisa Kimber L. Authorized by: Maxwell CaulRIPLETT,Arneda Sappington L. Consent: Verbal consent obtained. Risks and benefits: risks, benefits and alternatives were discussed Consent given by: patient Patient identity confirmed: provided demographic data Prepped and Draped in normal sterile fashion Wound explored  Laceration Location: dorsal left index finger  Laceration Length: 2 cm  No Foreign Bodies seen or palpated  Anesthesia: none  Irrigation method: syringe Amount of cleaning: standard  Finger clot applied briefly to control bleeding while wound examined and cleaned then removed.   Skin avulsed. No indication for sutures.    Bleeding controlled with application of Quick clot.  Finger bandaged and splinted.    Pt observed and bleeding well controlled.     Patient tolerance: Patient tolerated the procedure well with no immediate complications.  Medications Ordered in ED Medications - No data to display   Initial Impression / Assessment and Plan / ED Course  I have reviewed the triage vital signs and the nursing notes.  Pertinent labs & imaging results that were available during my care of the patient were reviewed by me and considered in my medical decision making (see chart for details).  Clinical Course   Finger splinted.  Bandaged.  Pt agrees to remove quick clot dressing in 48 hrs.  And keep the wound dry and bandaged.  Advised to return if bleeding returns.    Final Clinical Impressions(s) / ED Diagnoses   Final diagnoses:  Finger laceration, initial encounter    New Prescriptions Discharge Medication List as of 05/06/2016  7:39 PM         Lamberto Dinapoli, PA-C 05/08/16 1602    Margarita Grizzleanielle Ray, MD 05/26/16 [phone]
